# Patient Record
Sex: Male | Born: 1985 | Race: Black or African American | Hispanic: No | Marital: Single | Smoking: Never smoker
Health system: Southern US, Community
[De-identification: ages and names within clinical notes are randomized; demographics above are authoritative.]

---
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
## 2010-01-02 ENCOUNTER — Emergency Department (HOSPITAL_COMMUNITY): Admission: EM | Admit: 2010-01-02 | Discharge: 2010-01-02 | Payer: Self-pay | Admitting: Emergency Medicine

## 2010-03-28 ENCOUNTER — Emergency Department (HOSPITAL_COMMUNITY): Admission: EM | Admit: 2010-03-28 | Discharge: 2010-03-28 | Payer: Self-pay | Admitting: Emergency Medicine

## 2011-10-31 ENCOUNTER — Emergency Department (HOSPITAL_COMMUNITY)
Admission: EM | Admit: 2011-10-31 | Discharge: 2011-10-31 | Disposition: A | Discharge status: Home / Self Care | Payer: 59 | Source: Home / Self Care | Attending: Family Medicine | Admitting: Family Medicine

## 2011-10-31 ENCOUNTER — Encounter (HOSPITAL_COMMUNITY): Payer: Self-pay | Admitting: Emergency Medicine

## 2011-10-31 DIAGNOSIS — A084 Viral intestinal infection, unspecified: Secondary | ICD-10-CM

## 2011-10-31 DIAGNOSIS — J45909 Unspecified asthma, uncomplicated: Secondary | ICD-10-CM

## 2011-10-31 DIAGNOSIS — A09 Infectious gastroenteritis and colitis, unspecified: Secondary | ICD-10-CM

## 2011-10-31 MED ORDER — ALBUTEROL SULFATE HFA 108 (90 BASE) MCG/ACT IN AERS: 1.0000 | INHALATION_SPRAY | Freq: Four times a day (QID) | RESPIRATORY_TRACT | Status: DC | PRN

## 2011-10-31 MED ORDER — ONDANSETRON HCL 4 MG PO TABS: 4.0000 mg | ORAL_TABLET | Freq: Four times a day (QID) | ORAL | Status: AC

## 2011-10-31 NOTE — Discharge Instructions (Signed)
Clear liquid , bland diet today as tolerated, advance on wed as improved, use medicine as needed, return or see your doctor if any problems. °

## 2011-10-31 NOTE — ED Provider Notes (Signed)
History     CSN: [id number]  Arrival date & time 10/31/11  4098   First MD Initiated Contact with Patient 10/31/11 [phone]      Chief Complaint  Patient presents with  . Fatigue    (Consider location/radiation/quality/duration/timing/severity/associated sxs/prior treatment) Patient is a 26 y.o. male presenting with pharyngitis. The history is provided by the patient.  Sore Throat This is a new problem. The current episode started more than 1 week ago. The problem has been gradually improving. Associated symptoms comments: Now with [facility] with co-workers similarly ill.also nightly asthma acting up, .    Past Medical History  Diagnosis Date  . Asthma     History reviewed. No pertinent past surgical history.  No family history on file.  History  Substance Use Topics  . Smoking status: Never Smoker   . Smokeless tobacco: Not on file  . Alcohol Use: Yes      Review of Systems  Constitutional: Positive for appetite change. Negative for fever.  HENT: Positive for congestion, rhinorrhea and postnasal drip.   Respiratory: Positive for wheezing.   Gastrointestinal: Positive for nausea, vomiting and diarrhea.  Genitourinary: Negative.     Allergies  Shrimp  Home Medications   Current Outpatient Rx  Name Route Sig Dispense Refill  . ACETAMINOPHEN 325 MG PO TABS Oral Take 650 mg by mouth every 6 (six) hours as needed.    . ALBUTEROL SULFATE HFA 108 (90 BASE) MCG/ACT IN AERS Inhalation Inhale 1-2 puffs into the lungs every 6 (six) hours as needed for wheezing. 1 Inhaler 0  . ONDANSETRON HCL 4 MG PO TABS Oral Take 1 tablet (4 mg total) by mouth every 6 (six) hours. 8 tablet 0    BP 113/66  Pulse 61  Temp(Src) 98.5 F (36.9 C) (Oral)  Resp 14  SpO2 100%  Physical Exam  Nursing note and vitals reviewed. Constitutional: He appears well-developed and well-nourished.  HENT:  Head: Normocephalic.  Right Ear: External ear normal.  Left Ear: External ear normal.    Nose: Nose normal.  Mouth/Throat: Oropharynx is clear and moist.  Eyes: Pupils are equal, round, and reactive to light.  Neck: Normal range of motion. Neck supple.  Cardiovascular: Regular rhythm and normal heart sounds.   Pulmonary/Chest: Breath sounds normal.  Abdominal: Soft. Bowel sounds are normal. He exhibits no distension and no mass. There is no tenderness. There is no rebound and no guarding.  Lymphadenopathy:    He has no cervical adenopathy.  Skin: Skin is warm and dry.    ED Course  Procedures (including critical care time)  Labs Reviewed - No data to display No results found.   1. Gastroenteritis and colitis, viral   2. Asthma       MDM          Linna Hoff, MD 10/31/11 1130

## 2011-10-31 NOTE — ED Notes (Signed)
Onset one week ago of sore throat, this week patient has been feeling dizzy, nauseated, and weakness.  Throat no longer hurts.  Productive cough, dark brown phlegm per patient.  Reports asthma aggravated.

## 2011-12-07 ENCOUNTER — Emergency Department (HOSPITAL_COMMUNITY): Payer: 59

## 2011-12-07 ENCOUNTER — Encounter (HOSPITAL_COMMUNITY): Payer: Self-pay

## 2011-12-07 ENCOUNTER — Emergency Department (HOSPITAL_COMMUNITY)
Admission: EM | Admit: 2011-12-07 | Discharge: 2011-12-07 | Disposition: A | Discharge status: Home / Self Care | Payer: 59 | Attending: Emergency Medicine | Admitting: Emergency Medicine

## 2011-12-07 DIAGNOSIS — S6290XA Unspecified fracture of unspecified wrist and hand, initial encounter for closed fracture: Secondary | ICD-10-CM

## 2011-12-07 DIAGNOSIS — M25539 Pain in unspecified wrist: Secondary | ICD-10-CM | POA: Insufficient documentation

## 2011-12-07 DIAGNOSIS — J45909 Unspecified asthma, uncomplicated: Secondary | ICD-10-CM | POA: Insufficient documentation

## 2011-12-07 NOTE — Progress Notes (Signed)
Orthopedic Tech Progress Note Patient Details:  Ian Snyder 06/29/1985 [id number]  Ortho Devices Type of Ortho Device: Velcro wrist splint Ortho Device/Splint Location: left wrist Ortho Device/Splint Interventions: Application   Feather Berrie T 12/07/2011, 4:32 PM

## 2011-12-07 NOTE — Discharge Instructions (Signed)
Follow up with dr. Magnus Ivan next week.

## 2011-12-07 NOTE — ED Notes (Signed)
Ortho made aware of need for wrist splint

## 2011-12-07 NOTE — ED Provider Notes (Signed)
History    This chart was scribed for Benny Lennert, MD by Sofie Rower. The patient was seen in room TR09C/TR09C and the patient's care was started at 3:29 PM    CSN: [id number]  Arrival date & time 12/07/11  1420   None     Chief Complaint  Patient presents with  . Wrist Pain    (Consider location/radiation/quality/duration/timing/severity/associated sxs/prior treatment) Patient is a 26 y.o. male presenting with wrist pain. The history is provided by the patient. No language interpreter was used.  Wrist Pain This is a new problem. The current episode started 3 to 5 hours ago. The problem occurs constantly. The problem has not changed since onset.Pertinent negatives include no chest pain, no abdominal pain and no headaches. The symptoms are aggravated by bending. Nothing relieves the symptoms. He has tried nothing for the symptoms. The treatment provided no relief.    Past Medical History  Diagnosis Date  . Asthma     History  Substance Use Topics  . Smoking status: Never Smoker   . Smokeless tobacco: Not on file  . Alcohol Use: Yes      Review of Systems  Constitutional: Negative for fatigue.  HENT: Negative for congestion, sinus pressure and ear discharge.   Eyes: Negative for discharge.  Respiratory: Negative for cough.   Cardiovascular: Negative for chest pain.  Gastrointestinal: Negative for abdominal pain and diarrhea.  Genitourinary: Negative for frequency and hematuria.  Musculoskeletal: Negative for back pain.  Skin: Negative for rash.  Neurological: Negative for seizures and headaches.  Hematological: Negative.   Psychiatric/Behavioral: Negative for hallucinations.    Allergies  Shrimp  Home Medications   Current Outpatient Rx  Name Route Sig Dispense Refill  . ACETAMINOPHEN 325 MG PO TABS Oral Take 650 mg by mouth every 6 (six) hours as needed.    . ALBUTEROL SULFATE HFA 108 (90 BASE) MCG/ACT IN AERS Inhalation Inhale 1-2 puffs into the lungs  every 6 (six) hours as needed for wheezing. 1 Inhaler 0    BP 127/64  Pulse 62  Temp 98.6 F (37 C) (Oral)  Resp 16  SpO2 99%  Physical Exam  Nursing note and vitals reviewed. Constitutional: He is oriented to person, place, and time. He appears well-developed.  HENT:  Head: Normocephalic.  Eyes: Conjunctivae are normal.  Neck: No tracheal deviation present.  Cardiovascular:  No murmur heard. Musculoskeletal: Normal range of motion. He exhibits tenderness.       Swelling and tenderness to the mid dorsum of the left hand.   Neurological: He is oriented to person, place, and time.       Neurovascularly intact.   Skin: Skin is warm.  Psychiatric: He has a normal mood and affect.    ED Course  Procedures (including critical care time)  DIAGNOSTIC STUDIES: Oxygen Saturation is 99% on room air, normal by my interpretation.    COORDINATION OF CARE:  3:30PM- EDP at bedside discusses treatment plan concerning fracture.   5:53PM- EDP at bedside discusses results of x-ray.    Labs Reviewed - No data to display No results found for this or any previous visit. Dg Wrist Complete Left  12/07/2011  *RADIOLOGY REPORT*  Clinical Data: Extremity laceration  LEFT WRIST - COMPLETE 3+ VIEW  Comparison: None  Findings: On the lateral radiograph a fracture deformity is identified which likely corresponds to the dorsal aspect of the hamate bone.  This is difficult to see on the oblique and lateral projections. No other fractures  or subluxations identified.  IMPRESSION:  1.  Examination is positive for acute fracture.  This is felt to likely involving the dorsal aspect of the hamate bone.  Original Report Authenticated By: Rosealee Albee, M.D.      No diagnosis found.    MDM        The chart was scribed for me under my direct supervision.  I personally performed the history, physical, and medical decision making and all procedures in the evaluation of this patient.Benny Lennert, MD 12/07/11 [phone]

## 2011-12-07 NOTE — ED Notes (Signed)
Punched wall this am and had pain swelling nad deformity to left wrist.

## 2012-10-16 ENCOUNTER — Emergency Department (HOSPITAL_COMMUNITY)
Admission: EM | Admit: 2012-10-16 | Discharge: 2012-10-16 | Disposition: A | Discharge status: Home Health | Payer: 59 | Source: Home / Self Care

## 2012-10-16 ENCOUNTER — Encounter (HOSPITAL_COMMUNITY): Payer: Self-pay

## 2012-10-16 DIAGNOSIS — J45909 Unspecified asthma, uncomplicated: Secondary | ICD-10-CM

## 2012-10-16 DIAGNOSIS — J309 Allergic rhinitis, unspecified: Secondary | ICD-10-CM

## 2012-10-16 MED ORDER — ALBUTEROL SULFATE HFA 108 (90 BASE) MCG/ACT IN AERS: 2.0000 | INHALATION_SPRAY | RESPIRATORY_TRACT | Status: DC | PRN

## 2012-10-16 MED ORDER — MONTELUKAST SODIUM 10 MG PO TABS: 10.0000 mg | ORAL_TABLET | Freq: Every day | ORAL | Status: DC

## 2012-10-16 NOTE — ED Provider Notes (Signed)
History     CSN: [id number]  Arrival date & time 10/16/12  8110  27 year old male who presents to adult care clinic for evaluation of his asthma as well as medication refill. The patient primarily describes nocturnal symptoms. He occasionally has chest tightness during the day. However when he is in a recumbent position he prefers to use his inhaler for a daily basis at night. The patient also describes seasonal allergies and states that this year in particular, he experienced nasal congestion, exacerbation of his asthma symptoms more than the previous years.    Chief Complaint  Patient presents with  . Medication Refill    (Consider location/radiation/quality/duration/timing/severity/associated sxs/prior treatment) HPI  Past Medical History  Diagnosis Date  . Asthma     History reviewed. No pertinent past surgical history.  No family history on file.  History  Substance Use Topics  . Smoking status: Never Smoker   . Smokeless tobacco: Not on file  . Alcohol Use: Yes      Review of Systems orthopnea, wheezing at night Allergies  Shrimp  Home Medications   Current Outpatient Rx  Name  Route  Sig  Dispense  Refill  . acetaminophen (TYLENOL) 325 MG tablet   Oral   Take 650 mg by mouth every 6 (six) hours as needed.         Marland Kitchen albuterol (PROVENTIL HFA;VENTOLIN HFA) 108 (90 BASE) MCG/ACT inhaler   Inhalation   Inhale 2 puffs into the lungs every 4 (four) hours as needed for wheezing or shortness of breath.   1 Inhaler   10   . montelukast (SINGULAIR) 10 MG tablet   Oral   Take 1 tablet (10 mg total) by mouth at bedtime.   30 tablet   6     BP 129/64  Pulse 53  Temp(Src) 97.7 F (36.5 C) (Oral)  Resp 17  SpO2 95%  Physical Exam Neck: Normal range of motion. Neck supple. No JVD present. No tracheal deviation present. No thyromegaly present.  Cardiovascular: Normal rate, regular rhythm and normal heart sounds.  Pulmonary/Chest: Effort normal and breath  sounds normal. No respiratory distress. She has no wheezes.  Abdominal: Soft. Bowel sounds are normal.  Musculoskeletal: Normal range of motion. She exhibits no edema and no tenderness. Left 1st toe ingrown nail  Lymphadenopathy:  She has no cervical adenopathy.  Neurological: She is alert and oriented to person, place, and time. She has normal reflexes.  Skin: Skin is warm and dry.  Psychiatric: She has a normal mood and affect. Her behavior is normal. Judgment and thought content normal.   ED Course  Procedures (including critical care time)  Labs Reviewed - No data to display No results found.   1. Asthma, stable continue albuterol when necessary, follow up in 3 months   2. Allergic rhinitis start Singulair     Follow up in 3 months for asthma check  MDM         Richarda Overlie, MD 10/16/12 1323

## 2012-10-16 NOTE — ED Notes (Signed)
Patient has history of asthma Needs medication refill

## 2013-05-09 ENCOUNTER — Emergency Department (INDEPENDENT_AMBULATORY_CARE_PROVIDER_SITE_OTHER): Payer: 59

## 2013-05-09 ENCOUNTER — Encounter (HOSPITAL_COMMUNITY): Payer: Self-pay | Admitting: Emergency Medicine

## 2013-05-09 ENCOUNTER — Emergency Department (HOSPITAL_COMMUNITY)
Admission: EM | Admit: 2013-05-09 | Discharge: 2013-05-09 | Disposition: A | Discharge status: Home / Self Care | Payer: 59 | Source: Home / Self Care | Attending: Emergency Medicine | Admitting: Emergency Medicine

## 2013-05-09 ENCOUNTER — Ambulatory Visit (HOSPITAL_COMMUNITY)
Admit: 2013-05-09 | Discharge: 2013-05-09 | Disposition: A | Discharge status: Home / Self Care | Payer: 59 | Attending: Emergency Medicine | Admitting: Emergency Medicine

## 2013-05-09 DIAGNOSIS — S83429A Sprain of lateral collateral ligament of unspecified knee, initial encounter: Secondary | ICD-10-CM

## 2013-05-09 DIAGNOSIS — S83422A Sprain of lateral collateral ligament of left knee, initial encounter: Secondary | ICD-10-CM

## 2013-05-09 DIAGNOSIS — M7120 Synovial cyst of popliteal space [Baker], unspecified knee: Secondary | ICD-10-CM

## 2013-05-09 DIAGNOSIS — M79609 Pain in unspecified limb: Secondary | ICD-10-CM

## 2013-05-09 DIAGNOSIS — M7122 Synovial cyst of popliteal space [Baker], left knee: Secondary | ICD-10-CM

## 2013-05-09 DIAGNOSIS — M7989 Other specified soft tissue disorders: Secondary | ICD-10-CM | POA: Insufficient documentation

## 2013-05-09 MED ORDER — MELOXICAM 15 MG PO TABS: 15.0000 mg | ORAL_TABLET | Freq: Every day | ORAL | Status: DC

## 2013-05-09 MED ORDER — HYDROCODONE-ACETAMINOPHEN 5-325 MG PO TABS: ORAL_TABLET | ORAL | Status: DC

## 2013-05-09 MED ORDER — ACETAMINOPHEN 500 MG PO TABS
1000.0000 mg | ORAL_TABLET | Freq: Once | ORAL | Status: AC
  Administered 2013-05-09: 1000 mg via ORAL

## 2013-05-09 NOTE — Progress Notes (Signed)
*  Preliminary Results* Left lower extremity venous duplex completed. Left lower extremity is negative for deep vein thrombosis. There is evidence of a left Baker's cyst.  Preliminary results discussed with [facility].  05/09/2013 3:46 PM  Gertie Fey, RVT, RDCS, RDMS

## 2013-05-09 NOTE — ED Notes (Signed)
Called vascular lab and spoke with Ian Snyder.  She stated to send patient to [facility] to register and then to vascular lab.  Patient and Dr Lorenz Coaster made aware.  Patient taken by shuttle.

## 2013-05-09 NOTE — ED Provider Notes (Signed)
Chief Complaint:   Chief Complaint  Patient presents with  . Joint Swelling    History of Present Illness:   Ian Snyder is a 27 year old male who injured his left knee a month ago at work. He tripped over a pallet. Ever since then his left knee has been swollen, painful, and he's had pain in the popliteal fossa that extends down to the calf. It's worse if he walks or bends the knee. Sometimes the knee gives way and locks up. He denies any swelling of the ankle, there's been no chest pain or shortness of breath.  Review of Systems:  Other than noted above, the patient denies any of the following symptoms: Systemic:  No fevers, chills, sweats, or aches.  No fatigue or tiredness. Musculoskeletal:  No joint pain, arthritis, bursitis, swelling, back pain, or neck pain.  Neurological:  No muscular weakness, paresthesias, headache, or trouble with speech or coordination.  No dizziness.  PMFSH:  Past medical history, family history, social history, meds, and allergies were reviewed.  No prior history of knee pain or arthritis.  He has asthma and takes albuterol inhaler.  Physical Exam:   Vital signs:  BP 128/60  Pulse 54  Temp(Src) 98 F (36.7 C) (Oral)  Resp 14  SpO2 100% Gen:  Alert and oriented times 3.  In no distress. Musculoskeletal: There was pain to palpation over the lateral joint line and in the popliteal fossa. There was no palpable mass or cyst in the popliteal fossa. McMurray's test was negative.  Lachman's test was negative.  Anterior drawer test was negative.   Varus and valgus stress reveals some laxity of the lateral collateral ligament and pain with varus stress.  Otherwise, all joints had a full a ROM with no swelling, bruising or deformity.  No edema, pulses full. Extremities were warm and pink.  Capillary refill was brisk. He has moderate calf tenderness but negative Homans sign. Calf circumference was 43 cm bilaterally. Skin:  Clear, warm and dry.  No rash. Neuro:  Alert  and oriented times 3.  Muscle strength was normal.  Sensation was intact to light touch.   Results for orders placed during the hospital encounter of 05/09/13  D-DIMER, QUANTITATIVE      Result Value Range   D-Dimer, Quant 0.53 (*) 0.00 - 0.48 ug/mL-FEU   Radiology:  Dg Knee Complete 4 Views Left  05/09/2013   CLINICAL DATA:  Knee injury 1 month ago.  Now with posterior pain.  EXAM: LEFT KNEE - COMPLETE 4+ VIEW  COMPARISON:  None.  FINDINGS: No evidence for fracture. No subluxation or dislocation. No worrisome lytic or sclerotic osseous abnormality. There is a benign appearing sclerotic focus in the lateral aspect of the proximal tibial metaphysis, compatible with a healed nonossifying fibroma. No evidence for joint effusion.  IMPRESSION: No acute bony findings.  No evidence for joint effusion.   Electronically Signed   By: Kennith Center M.D.   On: 05/09/2013 13:44   I reviewed the images independently and personally and concur with the radiologist's findings.   Course in Urgent Care Center:   Since his d-dimer was mildly elevated he was sent to the hospital for a venous Doppler. This was negative for DVT but did show a popliteal cyst. He was placed in a knee immobilizer.  Assessment:  The primary encounter diagnosis was Popliteal cyst, left. A diagnosis of Lateral collateral ligament sprain of knee, left, initial encounter was also pertinent to this visit.  He'll need followup  with orthopedics. Given a note to rest for the next 3 days.  Plan:   1.  Meds:  The following meds were prescribed:   Discharge Medication List as of 05/09/2013  4:06 PM    START taking these medications   Details  HYDROcodone-acetaminophen (NORCO/VICODIN) 5-325 MG per tablet 1 to 2 tabs every 4 to 6 hours as needed for pain., Print    meloxicam (MOBIC) 15 MG tablet Take 1 tablet (15 mg total) by mouth daily., Starting 05/09/2013, Until Discontinued, Normal        2.  Patient Education/Counseling:  The patient  was given appropriate handouts, self care instructions, and instructed in symptomatic relief, including rest and activity, elevation, application of ice and compression.   3.  Follow up:  The patient was told to follow up if no better in 3 to 4 days, if becoming worse in any way, and given some red flag symptoms such as worsening pain which would prompt immediate return.  Follow up with Dr. Victorino Dike next week.     Reuben Likes, MD 05/09/13 2137

## 2013-05-09 NOTE — ED Notes (Signed)
States he injured his left knee about 1 month ago, and has pain in posterior aspect of left knee w swelling. Tender to palpation; NAD

## 2013-12-20 ENCOUNTER — Other Ambulatory Visit: Payer: Self-pay | Admitting: Internal Medicine

## 2014-01-06 ENCOUNTER — Emergency Department (HOSPITAL_COMMUNITY)
Admission: EM | Admit: 2014-01-06 | Discharge: 2014-01-06 | Disposition: A | Discharge status: Home / Self Care | Payer: 59 | Source: Home / Self Care | Attending: Family Medicine | Admitting: Family Medicine

## 2014-01-06 ENCOUNTER — Encounter (HOSPITAL_COMMUNITY): Payer: Self-pay | Admitting: Emergency Medicine

## 2014-01-06 DIAGNOSIS — J45909 Unspecified asthma, uncomplicated: Secondary | ICD-10-CM

## 2014-01-06 MED ORDER — ALBUTEROL SULFATE HFA 108 (90 BASE) MCG/ACT IN AERS: 2.0000 | INHALATION_SPRAY | RESPIRATORY_TRACT | Status: AC | PRN

## 2014-01-06 MED ORDER — MONTELUKAST SODIUM 10 MG PO TABS: 10.0000 mg | ORAL_TABLET | Freq: Every day | ORAL | Status: AC

## 2014-01-06 NOTE — ED Provider Notes (Signed)
Medical screening examination/treatment/procedure(s) were performed by resident physician or non-physician practitioner and as supervising physician I was immediately available for consultation/collaboration.   Rayan Ines DOUGLAS MD.   Xavia Kniskern D Kanae Ignatowski, MD 01/06/14 1702 

## 2014-01-06 NOTE — ED Provider Notes (Signed)
CSN: [id number]     Arrival date & time 01/06/14  1351 History   First MD Initiated Contact with Patient 01/06/14 1446     Chief Complaint  Patient presents with  . Medication Refill   (Consider location/radiation/quality/duration/timing/severity/associated sxs/prior Treatment) HPI Comments: 28 year old male with history of asthma presents requesting refill of his albuterol. His asthma has been flaring up at night and when he is outside for long time, he gets chest tightness and wheezing. He ran out of his inhaler 2 days ago. He was previously using it about once per day. He was previously on Singulair but ran out 2 months ago, when he was taking that he rarely ever used the inhaler. He denies any symptoms right now, but the symptoms at night have been persistent and consistent. He does not feel sick at all.   Past Medical History  Diagnosis Date  . Asthma     as a child   History reviewed. No pertinent past surgical history. History reviewed. No pertinent family history. History  Substance Use Topics  . Smoking status: Never Smoker   . Smokeless tobacco: Not on file  . Alcohol Use: No    Review of Systems  Respiratory: Positive for chest tightness and wheezing.   All other systems reviewed and are negative.   Allergies  Aspirin and Shrimp  Home Medications   Prior to Admission medications   Medication Sig Start Date End Date Taking? Authorizing Provider  acetaminophen (TYLENOL) 325 MG tablet Take 650 mg by mouth every 6 (six) hours as needed.    Historical Provider, MD  albuterol (PROVENTIL HFA;VENTOLIN HFA) 108 (90 BASE) MCG/ACT inhaler Inhale 2 puffs into the lungs every 4 (four) hours as needed for wheezing or shortness of breath. 01/06/14 01/06/15  Graylon GoodZachary H Sigmond Patalano, PA-C  HYDROcodone-acetaminophen (NORCO/VICODIN) 5-325 MG per tablet 1 to 2 tabs every 4 to 6 hours as needed for pain. 05/09/13   Reuben Likesavid C Keller, MD  meloxicam (MOBIC) 15 MG tablet Take 1 tablet (15 mg total) by  mouth daily. 05/09/13   Reuben Likesavid C Keller, MD  montelukast (SINGULAIR) 10 MG tablet Take 1 tablet (10 mg total) by mouth at bedtime. 01/06/14   Adrian BlackwaterZachary H Jovane Foutz, PA-C   BP 124/62  Pulse 50  Temp(Src) 98.6 F (37 C) (Oral)  Resp 14  SpO2 100% Physical Exam  Nursing note and vitals reviewed. Constitutional: He is oriented to person, place, and time. He appears well-developed and well-nourished. No distress.  HENT:  Head: Normocephalic.  Cardiovascular: Normal rate, regular rhythm and normal heart sounds.   Pulmonary/Chest: Effort normal and breath sounds normal. No respiratory distress. He has no wheezes. He has no rales.  Neurological: He is alert and oriented to person, place, and time. Coordination normal.  Skin: Skin is warm and dry. No rash noted. He is not diaphoretic.  Psychiatric: He has a normal mood and affect. Judgment normal.    ED Course  Procedures (including critical care time) Labs Review Labs Reviewed - No data to display  Imaging Review No results found.   MDM   1. Asthma, unspecified asthma severity, uncomplicated    Physical exam is completely normal at this time, no need for intervention right now. Will refill albuterol and Singulair. Followup with primary care   Meds ordered this encounter  Medications  . montelukast (SINGULAIR) 10 MG tablet    Sig: Take 1 tablet (10 mg total) by mouth at bedtime.    Dispense:  30 tablet  Refill:  2    Order Specific Question:  Supervising Provider    Answer:  Linna Hoff [phone]  . albuterol (PROVENTIL HFA;VENTOLIN HFA) 108 (90 BASE) MCG/ACT inhaler    Sig: Inhale 2 puffs into the lungs every 4 (four) hours as needed for wheezing or shortness of breath.    Dispense:  1 Inhaler    Refill:  2    Order Specific Question:  Supervising Provider    Answer:  Bradd Canary D [[id number]]       Graylon Good, PA-C 01/06/14 1504

## 2014-01-06 NOTE — ED Notes (Signed)
States he needs a refill of his inhaler " the blue one" ? Albuteral on his previous record.  States his asthma is flaring up at night.  He does yard work.

## 2014-01-06 NOTE — Discharge Instructions (Signed)

## 2014-01-07 NOTE — ED Notes (Signed)
Male called id herself as fiancee , asking for new Rx for him. He is not available to talk on phone, and was advised he will need to call , as we cannot discuss case w her. Patient was provided w rx as previously dispensed . No changes w/o MD approval or discussion w patient

## 2014-04-30 ENCOUNTER — Encounter (HOSPITAL_COMMUNITY): Payer: Self-pay | Admitting: Emergency Medicine

## 2014-04-30 ENCOUNTER — Emergency Department (HOSPITAL_COMMUNITY)
Admission: EM | Admit: 2014-04-30 | Discharge: 2014-04-30 | Disposition: A | Discharge status: Home / Self Care | Payer: 59 | Source: Home / Self Care | Attending: Emergency Medicine | Admitting: Emergency Medicine

## 2014-04-30 DIAGNOSIS — J069 Acute upper respiratory infection, unspecified: Secondary | ICD-10-CM

## 2014-04-30 DIAGNOSIS — R55 Syncope and collapse: Secondary | ICD-10-CM

## 2014-04-30 LAB — POCT I-STAT, CHEM 8
BUN: 17 mg/dL (ref 6–23)
Calcium, Ion: 1.21 mmol/L (ref 1.12–1.23)
Chloride: 102 mEq/L (ref 96–112)
Creatinine, Ser: 1 mg/dL (ref 0.50–1.35)
Glucose, Bld: 85 mg/dL (ref 70–99)
HCT: 46 % (ref 39.0–52.0)
Hemoglobin: 15.6 g/dL (ref 13.0–17.0)
Potassium: 4.1 mEq/L (ref 3.7–5.3)
SODIUM: 139 meq/L (ref 137–147)
TCO2: 26 mmol/L (ref 0–100)

## 2014-04-30 MED ORDER — VENTOLIN HFA 108 (90 BASE) MCG/ACT IN AERS: 2.0000 | INHALATION_SPRAY | RESPIRATORY_TRACT | Status: DC | PRN

## 2014-04-30 MED ORDER — IPRATROPIUM BROMIDE 0.06 % NA SOLN: 2.0000 | Freq: Four times a day (QID) | NASAL | Status: DC

## 2014-04-30 MED ORDER — MONTELUKAST SODIUM 10 MG PO TABS: 10.0000 mg | ORAL_TABLET | Freq: Every day | ORAL | Status: DC

## 2014-04-30 NOTE — ED Provider Notes (Signed)
Chief Complaint   Cough   History of Present Illness   Ian Snyder is a 28 year old male who had some mild URI symptoms this morning with nasal congestion and rhinorrhea. While at work he felt dizzy, lightheaded, and near syncope, but he did not pass out. He denied any whirling vertigo. He did not have any chest pain or shortness of breath. He has a history of asthma and is on pro-air. He's had a few loose stools. No fever or chills. He states he's been eating and drinking well. He denies any injury, new medications, drug or alcohol use, prior syncope, family history as syncope or heart disease, headache, chest or back pain, shortness of breath, or rectal bleeding.  Review of Systems   Other than noted above, the patient denies any of the following symptoms. Systemic:  No fever, chills, or fatigue. Pulmonary:  No cough, wheezing, shortness of breath. Cardiac:  No chest pain, tightness, pressure, palpitations, PND, orthopnea, or edema. Ext:  No leg pain or swelling. Neuro:  No weakness, paresthesias, or difficulty with speech or gait. No vertigo or difficulty with coordination or balance. No seizure activity, tongue biting, or incontinence.  Psych:  No anxiety or depression.  PMFSH   Past medical history, family history, social history, meds, and allergies were reviewed.    Physical Examination    Vital signs:  BP 124/62 mmHg  Pulse 59  Temp(Src) 98.4 F (36.9 C) (Oral)  Resp 20  SpO2 98%  Orthostatic VS for the past 24 hrs:  BP- Lying Pulse- Lying BP- Sitting Pulse- Sitting BP- Standing at 0 minutes Pulse- Standing at 0 minutes  04/30/14 1417 127/63 mmHg 63 117/76 mmHg 60 114/68 mmHg 66   Gen:  Alert, oriented, in no distress, skin warm and dry. Eye:  PERRL, lids and conjunctivas normal.  No stare or lid lag. ENT:  Mucous membranes moist, pharynx clear. Nasal mucosa was congested. Neck:  Supple, no adenopathy or tenderness.  No JVD.  Thyroid not enlarged. Lungs:  Clear to  auscultation, no wheezes, rales or rhonchi.  No respiratory distress. Heart:  Regular rhythm, no extrasystoles.  No gallops, murmers, clicks or rubs. Abdomen:  Soft, nontender, no organomegaly or mass.  Bowel sounds normal.  No pulsatile abdominal mass or bruit. Ext:  No edema. Pulses full and equal. Neuro:  Neurological examination: The patient is alert and oriented x3. Speech is clear, fluent, and appropriate. Cranial nerves are intact. There is no pronator drift and finger to nose was normal. Muscle strength, sensation, and DTRs are normal. Babinskis are downgoing. Station and gait were normal. Romberg sign is negative, patient is able to perform tandem gait well. Skin:  Warm and dry.  No rash.  Labs   Results for orders placed or performed during the hospital encounter of 04/30/14  I-STAT, chem 8  Result Value Ref Range   Sodium 139 137 - 147 mEq/L   Potassium 4.1 3.7 - 5.3 mEq/L   Chloride 102 96 - 112 mEq/L   BUN 17 6 - 23 mg/dL   Creatinine, Ser 1.611.00 0.50 - 1.35 mg/dL   Glucose, Bld 85 70 - 99 mg/dL   Calcium, Ion 0.961.21 0.451.12 - 1.23 mmol/L   TCO2 26 0 - 100 mmol/L   Hemoglobin 15.6 13.0 - 17.0 g/dL   HCT 40.946.0 81.139.0 - 91.452.0 %    EKG Results:  Date: 04/30/2014  Rate: 47  Rhythm: normal sinus rhythm and premature atrial contractions (PAC)  QRS Axis: normal  Intervals:  normal  ST/T Wave abnormalities: early repolarization  Conduction Disutrbances:none  Narrative Interpretation: Sinus bradycardia with blocked premature atrial complexes and early repolarization.  There was no indication of QT prolongation, WPW, Brugada syndrome, ischemia, arrhythmia, or hypertrophic cardiomyopathy.   Old EKG Reviewed: none available   Assessment   The primary encounter diagnosis was Viral URI. A diagnosis of Pre-syncope was also pertinent to this visit.  There is no evidence of subarachnoid hemorrhage, ruptured aortic aneurism, pulmonary embolism, acute coronary syndrome, aortic dissection, anemia,  blood loss, dehydration, or electrolyte imbalance.   Plan     1.  Meds:  The following meds were prescribed:   Discharge Medication List as of 04/30/2014  2:39 PM    START taking these medications   Details  ipratropium (ATROVENT) 0.06 % nasal spray Place 2 sprays into both nostrils 4 (four) times daily., Starting 04/30/2014, Until Discontinued, Normal    !! montelukast (SINGULAIR) 10 MG tablet Take 1 tablet (10 mg total) by mouth at bedtime., Starting 04/30/2014, Until Discontinued, Normal    !! VENTOLIN HFA 108 (90 BASE) MCG/ACT inhaler Inhale 2 puffs into the lungs every 4 (four) hours as needed for wheezing or shortness of breath., Starting 04/30/2014, Until Discontinued, Normal     !! - Potential duplicate medications found. Please discuss with provider.      2.  Patient Education/Counseling:  The patient was given appropriate handouts, self care instructions, and instructed in symptomatic relief.    3.  Follow up:  The patient was told to follow up here if no better in 3 to 4 days, or sooner if becoming worse in any way, and given some red flag symptoms such as syncope, presyncope, dyspnea, or chest pain which would prompt immediate return.      Reuben Likesavid C [employer] [employer], MD 04/30/14 [phone]

## 2014-04-30 NOTE — Discharge Instructions (Signed)

## 2014-04-30 NOTE — ED Notes (Signed)
Pt stateshe has been feeling sick this morning at work and he got light headed at work. Pt in no acute distress pt did not lose consiciousness.

## 2014-06-08 ENCOUNTER — Emergency Department (HOSPITAL_COMMUNITY)
Admission: EM | Admit: 2014-06-08 | Discharge: 2014-06-08 | Disposition: A | Discharge status: Home / Self Care | Payer: 59 | Source: Home / Self Care | Attending: Emergency Medicine | Admitting: Emergency Medicine

## 2014-06-08 ENCOUNTER — Encounter (HOSPITAL_COMMUNITY): Payer: Self-pay

## 2014-06-08 DIAGNOSIS — A084 Viral intestinal infection, unspecified: Secondary | ICD-10-CM

## 2014-06-08 MED ORDER — ONDANSETRON HCL 4 MG PO TABS: 4.0000 mg | ORAL_TABLET | Freq: Four times a day (QID) | ORAL | Status: DC

## 2014-06-08 NOTE — Discharge Instructions (Signed)
Viral Gastroenteritis Immodium AD for excessive diarrhea. Just slow it down, dont stop it. gatorade and water Viral gastroenteritis is also known as stomach flu. This condition affects the stomach and intestinal tract. It can cause sudden diarrhea and vomiting. The illness typically lasts 3 to 8 days. Most people develop an immune response that eventually gets rid of the virus. While this natural response develops, the virus can make you quite ill. CAUSES  Many different viruses can cause gastroenteritis, such as rotavirus or noroviruses. You can catch one of these viruses by consuming contaminated food or water. You may also catch a virus by sharing utensils or other personal items with an infected person or by touching a contaminated surface. SYMPTOMS  The most common symptoms are diarrhea and vomiting. These problems can cause a severe loss of body fluids (dehydration) and a body salt (electrolyte) imbalance. Other symptoms may include:  Fever.  Headache.  Fatigue.  Abdominal pain. DIAGNOSIS  Your caregiver can usually diagnose viral gastroenteritis based on your symptoms and a physical exam. A stool sample may also be taken to test for the presence of viruses or other infections. TREATMENT  This illness typically goes away on its own. Treatments are aimed at rehydration. The most serious cases of viral gastroenteritis involve vomiting so severely that you are not able to keep fluids down. In these cases, fluids must be given through an intravenous line (IV). HOME CARE INSTRUCTIONS   Drink enough fluids to keep your urine clear or pale yellow. Drink small amounts of fluids frequently and increase the amounts as tolerated.  Ask your caregiver for specific rehydration instructions.  Avoid:  Foods high in sugar.  Alcohol.  Carbonated drinks.  Tobacco.  Juice.  Caffeine drinks.  Extremely hot or cold fluids.  Fatty, greasy foods.  Too much intake of anything at one  time.  Dairy products until 24 to 48 hours after diarrhea stops.  You may consume probiotics. Probiotics are active cultures of beneficial bacteria. They may lessen the amount and number of diarrheal stools in adults. Probiotics can be found in yogurt with active cultures and in supplements.  Wash your hands well to avoid spreading the virus.  Only take over-the-counter or prescription medicines for pain, discomfort, or fever as directed by your caregiver. Do not give aspirin to children. Antidiarrheal medicines are not recommended.  Ask your caregiver if you should continue to take your regular prescribed and over-the-counter medicines.  Keep all follow-up appointments as directed by your caregiver. SEEK IMMEDIATE MEDICAL CARE IF:   You are unable to keep fluids down.  You do not urinate at least once every 6 to 8 hours.  You develop shortness of breath.  You notice blood in your stool or vomit. This may look like coffee grounds.  You have abdominal pain that increases or is concentrated in one small area (localized).  You have persistent vomiting or diarrhea.  You have a fever.  The patient is a child younger than 3 months, and he or she has a fever.  The patient is a child older than 3 months, and he or she has a fever and persistent symptoms.  The patient is a child older than 3 months, and he or she has a fever and symptoms suddenly get worse.  The patient is a baby, and he or she has no tears when crying. MAKE SURE YOU:   Understand these instructions.  Will watch your condition.  Will get help right away if you are  not doing well or get worse. Document Released: 05/29/2005 Document Revised: 08/21/2011 Document Reviewed: 03/15/2011 [facility] Patient Information 2015 [place], [place]. This information is not intended to replace advice given to you by your health care provider. Make sure you discuss any questions you have with your health care provider.

## 2014-06-08 NOTE — ED Notes (Signed)
States there  are several people at work who have had a stomach bug. C/o onset of Saturday of his symptoms. N/V/D, lower/mid abdominal pain. NAD

## 2014-06-08 NOTE — ED Provider Notes (Signed)
CSN: [id number]     Arrival date & time 06/08/14  1038 History   First MD Initiated Contact with Patient 06/08/14 1109     Chief Complaint  Patient presents with  . GI Problem   (Consider location/radiation/quality/duration/timing/severity/associated sxs/prior Treatment) HPI Comments: 28 year old male complaining of nausea, vomiting and diarrhea for approximately 36 hours. It is associated with mild intermittent left lower quadrant discomfort. The vomiting is slow down with 2 episodes yesterday and none today. He continues to have nausea. Diarrhea is too numerous to count. Having watery nonbloody stools. Denies fever or myalgias. Has been trying to eat food but this causes nausea and vomiting.   Past Medical History  Diagnosis Date  . Asthma     as a child   History reviewed. No pertinent past surgical history. History reviewed. No pertinent family history. History  Substance Use Topics  . Smoking status: Never Smoker   . Smokeless tobacco: Not on file  . Alcohol Use: No    Review of Systems  Constitutional: Positive for activity change. Negative for fever.  HENT: Negative.   Respiratory: Negative.   Cardiovascular: Negative for chest pain.  Gastrointestinal: Positive for nausea, vomiting, abdominal pain and diarrhea. Negative for constipation and blood in stool.  Genitourinary: Negative.   Musculoskeletal: Negative.     Allergies  Aspirin and Shrimp  Home Medications   Prior to Admission medications   Medication Sig Start Date End Date Taking? Authorizing Provider  acetaminophen (TYLENOL) 325 MG tablet Take 650 mg by mouth every 6 (six) hours as needed.    Historical Provider, MD  albuterol (PROVENTIL HFA;VENTOLIN HFA) 108 (90 BASE) MCG/ACT inhaler Inhale 2 puffs into the lungs every 4 (four) hours as needed for wheezing or shortness of breath. 01/06/14 01/06/15  Graylon GoodZachary H Baker, PA-C  ipratropium (ATROVENT) 0.06 % nasal spray Place 2 sprays into both nostrils 4 (four)  times daily. 04/30/14   Reuben Likesavid C Keller, MD  montelukast (SINGULAIR) 10 MG tablet Take 1 tablet (10 mg total) by mouth at bedtime. 01/06/14   Adrian BlackwaterZachary H Baker, PA-C  ondansetron (ZOFRAN) 4 MG tablet Take 1 tablet (4 mg total) by mouth every 6 (six) hours. 06/08/14   Hayden Rasmussenavid Jadasia Haws, NP  VENTOLIN HFA 108 (90 BASE) MCG/ACT inhaler Inhale 2 puffs into the lungs every 4 (four) hours as needed for wheezing or shortness of breath. 04/30/14   Reuben Likesavid C Keller, MD   BP 136/78 mmHg  Pulse 68  Temp(Src) 98.4 F (36.9 C) (Oral)  Resp 14  SpO2 100% Physical Exam  Constitutional: He is oriented to person, place, and time. He appears well-developed and well-nourished. No distress.  Eyes: Conjunctivae and EOM are normal.  Neck: Normal range of motion. Neck supple.  Cardiovascular: Normal rate, regular rhythm and normal heart sounds.   Pulmonary/Chest: Effort normal and breath sounds normal. No respiratory distress. He has no wheezes. He has no rales.  Abdominal: Soft. Bowel sounds are normal. He exhibits no distension. There is no rebound and no guarding.  Minor tenderness in the left lower quadrant.  Musculoskeletal: Normal range of motion. He exhibits no edema.  Lymphadenopathy:    He has no cervical adenopathy.  Neurological: He is alert and oriented to person, place, and time.  Skin: Skin is warm and dry.  Psychiatric: He has a normal mood and affect.  Nursing note and vitals reviewed.   ED Course  Procedures (including critical care time) Labs Review Labs Reviewed - No data to display  Imaging Review  No results found.   MDM   1. Viral gastroenteritis    Immodium AD for excessive diarrhea. Just slow it down, dont stop it. gatorade and water zofran as dir Diet as dir, bland, small amts when no more N or V. For new or worsening symptoms such as localized abdominal pain particularly in the right lower quadrant, fever, increased or worsening vomiting and diarrhea sick medical attention  promptly.    Hayden Rasmussenavid Liam Bossman, NP 06/08/14 1149
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
## 2016-04-20 ENCOUNTER — Encounter (HOSPITAL_COMMUNITY): Payer: Self-pay | Admitting: Emergency Medicine

## 2016-04-20 ENCOUNTER — Emergency Department (HOSPITAL_COMMUNITY)
Admission: EM | Admit: 2016-04-20 | Discharge: 2016-04-20 | Disposition: A | Discharge status: Home / Self Care | Payer: 59 | Attending: Emergency Medicine | Admitting: Emergency Medicine

## 2016-04-20 DIAGNOSIS — S3992XA Unspecified injury of lower back, initial encounter: Secondary | ICD-10-CM | POA: Diagnosis not present

## 2016-04-20 DIAGNOSIS — Y999 Unspecified external cause status: Secondary | ICD-10-CM | POA: Diagnosis not present

## 2016-04-20 DIAGNOSIS — M545 Low back pain, unspecified: Secondary | ICD-10-CM

## 2016-04-20 DIAGNOSIS — Y939 Activity, unspecified: Secondary | ICD-10-CM | POA: Insufficient documentation

## 2016-04-20 DIAGNOSIS — Y92410 Unspecified street and highway as the place of occurrence of the external cause: Secondary | ICD-10-CM | POA: Diagnosis not present

## 2016-04-20 DIAGNOSIS — M542 Cervicalgia: Secondary | ICD-10-CM | POA: Insufficient documentation

## 2016-04-20 DIAGNOSIS — J45909 Unspecified asthma, uncomplicated: Secondary | ICD-10-CM | POA: Diagnosis not present

## 2016-04-20 MED ORDER — METHOCARBAMOL 500 MG PO TABS: 500.0000 mg | ORAL_TABLET | Freq: Two times a day (BID) | ORAL | 0 refills | Status: DC | PRN

## 2016-04-20 MED ORDER — NAPROXEN 250 MG PO TABS: 250.0000 mg | ORAL_TABLET | Freq: Two times a day (BID) | ORAL | 0 refills | Status: DC
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
## 2019-04-24 ENCOUNTER — Other Ambulatory Visit: Payer: Self-pay

## 2019-04-24 ENCOUNTER — Encounter (HOSPITAL_COMMUNITY): Payer: Self-pay | Admitting: Urgent Care

## 2019-04-24 ENCOUNTER — Ambulatory Visit (HOSPITAL_COMMUNITY)
Admission: EM | Admit: 2019-04-24 | Discharge: 2019-04-24 | Disposition: A | Discharge status: Home / Self Care | Payer: Managed Care, Other (non HMO) | Attending: Urgent Care | Admitting: Urgent Care

## 2019-04-24 DIAGNOSIS — K529 Noninfective gastroenteritis and colitis, unspecified: Secondary | ICD-10-CM

## 2019-04-24 DIAGNOSIS — R110 Nausea: Secondary | ICD-10-CM

## 2019-04-24 MED ORDER — ONDANSETRON 8 MG PO TBDP: 8.0000 mg | ORAL_TABLET | Freq: Three times a day (TID) | ORAL | 0 refills | Status: DC | PRN

## 2019-04-24 MED ORDER — ONDANSETRON 8 MG PO TBDP: 8.0000 mg | ORAL_TABLET | Freq: Three times a day (TID) | ORAL | 0 refills | Status: AC | PRN

## 2019-04-24 NOTE — ED Provider Notes (Signed)
[place]   MRN: [id number] DOB: Feb 12, 1986  Subjective:   Ian Snyder is a 33 y.o. male presenting for 4-day history of nausea without vomiting and diarrhea.  Symptoms occurred after eating 9 entire pack of pistachios.  Bloating after working several bouts of diarrhea for that 1 day but has continued to feel quesy since then.  Has not taken medications for relief.  Denies any known COVID-19 contacts.  No current facility-administered medications for this encounter.   Current Outpatient Medications:  .  albuterol (PROVENTIL HFA;VENTOLIN HFA) 108 (90 BASE) MCG/ACT inhaler, Inhale 2 puffs into the lungs every 4 (four) hours as needed for wheezing or shortness of breath., Disp: 1 Inhaler, Rfl: 2 .  montelukast (SINGULAIR) 10 MG tablet, Take 1 tablet (10 mg total) by mouth at bedtime., Disp: 30 tablet, Rfl: 2   Allergies  Allergen Reactions  . Aspirin Hives    or Advil- not sure which.  . Shrimp [Shellfish Allergy]     Sea food    Past Medical History:  Diagnosis Date  . Asthma    as a child     No past surgical history on file.  No family history on file.  Social History   Tobacco Use  . Smoking status: Never Smoker  . Smokeless tobacco: Never Used  Substance Use Topics  . Alcohol use: No  . Drug use: No    Review of Systems  Constitutional: Negative for fever and malaise/fatigue.  HENT: Negative for congestion, ear pain, sinus pain and sore throat.   Eyes: Negative for discharge and redness.  Respiratory: Negative for cough, hemoptysis, shortness of breath and wheezing.   Cardiovascular: Negative for chest pain.  Gastrointestinal: Positive for diarrhea (Now resolved) and nausea. Negative for abdominal pain, blood in stool, constipation and vomiting.  Genitourinary: Negative for dysuria, flank pain and hematuria.  Musculoskeletal: Negative for myalgias.  Skin: Negative for rash.  Neurological: Negative for dizziness, weakness and headaches.   Psychiatric/Behavioral: Negative for depression and substance abuse.     Objective:   Vitals: BP 116/63 (BP Location: Right Arm)   Pulse (!) 58   Temp 98.5 F (36.9 C) (Oral)   Resp 16   SpO2 97%   Physical Exam Constitutional:      General: He is not in acute distress.    Appearance: Normal appearance. He is well-developed and normal weight. He is not ill-appearing, toxic-appearing or diaphoretic.  HENT:     Head: Normocephalic and atraumatic.     Right Ear: External ear normal.     Left Ear: External ear normal.     Nose: Nose normal.     Mouth/Throat:     Pharynx: Oropharynx is clear.  Eyes:     General: No scleral icterus.    Extraocular Movements: Extraocular movements intact.     Pupils: Pupils are equal, round, and reactive to light.  Cardiovascular:     Rate and Rhythm: Normal rate and regular rhythm.     Heart sounds: No murmur. No friction rub. No gallop.   Pulmonary:     Effort: Pulmonary effort is normal. No respiratory distress.     Breath sounds: No wheezing or rales.  Abdominal:     General: Bowel sounds are normal. There is no distension.     Palpations: Abdomen is soft. There is no mass.     Tenderness: There is no abdominal tenderness. There is no right CVA tenderness, left CVA tenderness, guarding or rebound.  Skin:  General: Skin is warm and dry.  Neurological:     Mental Status: He is alert and oriented to person, place, and time.  Psychiatric:        Mood and Affect: Mood normal.        Behavior: Behavior normal.        Thought Content: Thought content normal.        Judgment: Judgment normal.     Assessment and Plan :   1. Gastroenteritis   2. Nausea     We will manage for gastroenteritis with supportive care.  Recommended patient hydrate well, eat light meals and maintain electrolytes.  Will use Zofran nausea. Counseled patient on potential for adverse effects with medications prescribed/recommended today, ER and return-to-clinic  precautions discussed, patient verbalized understanding.    Wallis Bamberg, PA-C 04/24/19 1500

## 2019-04-24 NOTE — ED Triage Notes (Signed)
Pt states having nauseous x 3 days. Pt states he had diarrhea 5 days ago after he ate some pistachios. Pt denies any other symptoms.

## 2022-10-28 ENCOUNTER — Emergency Department (HOSPITAL_COMMUNITY)
Admission: EM | Admit: 2022-10-28 | Discharge: 2022-10-28 | Disposition: A | Discharge status: Home / Self Care | Payer: BC Managed Care – PPO | Attending: Emergency Medicine | Admitting: Emergency Medicine

## 2022-10-28 ENCOUNTER — Other Ambulatory Visit: Payer: Self-pay

## 2022-10-28 ENCOUNTER — Encounter (HOSPITAL_COMMUNITY): Payer: Self-pay

## 2022-10-28 ENCOUNTER — Emergency Department (HOSPITAL_COMMUNITY): Payer: BC Managed Care – PPO

## 2022-10-28 DIAGNOSIS — S60111A Contusion of right thumb with damage to nail, initial encounter: Secondary | ICD-10-CM | POA: Insufficient documentation

## 2022-10-28 DIAGNOSIS — W232XXA Caught, crushed, jammed or pinched between a moving and stationary object, initial encounter: Secondary | ICD-10-CM | POA: Diagnosis not present

## 2022-10-28 DIAGNOSIS — S6991XA Unspecified injury of right wrist, hand and finger(s), initial encounter: Secondary | ICD-10-CM | POA: Diagnosis present

## 2022-10-28 DIAGNOSIS — Z23 Encounter for immunization: Secondary | ICD-10-CM | POA: Insufficient documentation

## 2022-10-28 MED ORDER — TETANUS-DIPHTH-ACELL PERTUSSIS 5-2.5-18.5 LF-MCG/0.5 IM SUSY
0.5000 mL | PREFILLED_SYRINGE | Freq: Once | INTRAMUSCULAR | Status: AC
  Administered 2022-10-28: 0.5 mL via INTRAMUSCULAR
  Filled 2022-10-28: qty 0.5

## 2022-10-28 MED ORDER — LIDOCAINE HCL (PF) 1 % IJ SOLN
30.0000 mL | Freq: Once | INTRAMUSCULAR | Status: AC
  Administered 2022-10-28: 30 mL
  Filled 2022-10-28: qty 30

## 2022-10-28 NOTE — Discharge Instructions (Signed)
Thank you for letting us take care of you today.  Your x-ray was negative.  We drained the area under your nail with good result.  Please keep this clean and dry as we discussed.  For any new or worsening condition, please return to the nearest ED for reevaluation.

## 2022-10-28 NOTE — ED Provider Notes (Signed)
[place] EMERGENCY DEPARTMENT AT [facility] Provider Note   CSN: [id number] Arrival date & time: 10/28/22  1338     History  Chief Complaint  Patient presents with   Finger Injury    Ian Snyder is a 37 y.o. male who presents to ED c/o right thumb pain after jamming it in car door 5 days ago. No other injury. No paresthesias. No anticoagulant use.       Home Medications Prior to Admission medications   Medication Sig Start Date End Date Taking? Authorizing Provider  albuterol (PROVENTIL HFA;VENTOLIN HFA) 108 (90 BASE) MCG/ACT inhaler Inhale 2 puffs into the lungs every 4 (four) hours as needed for wheezing or shortness of breath. 01/06/14 01/06/15  Excell Seltzer, Adrian Blackwater, PA-C  montelukast (SINGULAIR) 10 MG tablet Take 1 tablet (10 mg total) by mouth at bedtime. 01/06/14   Excell Seltzer, Adrian Blackwater, PA-C  ondansetron (ZOFRAN-ODT) 8 MG disintegrating tablet Take 1 tablet (8 mg total) by mouth every 8 (eight) hours as needed for nausea. 04/24/19   Wallis Bamberg, PA-C  ipratropium (ATROVENT) 0.06 % nasal spray Place 2 sprays into both nostrils 4 (four) times daily. 04/30/14 04/24/19  Reuben Likes, MD      Allergies    Aspirin and Shrimp [shellfish allergy]    Review of Systems   Review of Systems  All other systems reviewed and are negative.   Physical Exam Updated Vital Signs BP (!) 137/94 (BP Location: Left Arm)   Pulse 67   Temp 98 F (36.7 C) (Oral)   Resp 16   SpO2 100%  Physical Exam Vitals and nursing note reviewed.  Constitutional:      General: He is not in acute distress.    Appearance: Normal appearance.  HENT:     Head: Normocephalic and atraumatic.     Mouth/Throat:     Mouth: Mucous membranes are moist.  Eyes:     Conjunctiva/sclera: Conjunctivae normal.  Cardiovascular:     Rate and Rhythm: Normal rate and regular rhythm.     Heart sounds: No murmur heard. Pulmonary:     Effort: Pulmonary effort is normal.  Abdominal:     General: Abdomen is  flat.     Palpations: Abdomen is soft.  Musculoskeletal:     Cervical back: Neck supple.     Right lower leg: No edema.     Left lower leg: No edema.     Comments: Subungual hematoma under right thumb with visualization of darkened, old appearing blood with extension just proximal to nail with mild amount of fluctuance and tenderness to palpation, nail is intact, no obvious deformity, neurovascularly intact, range of motion slightly limited at IP joint secondary to swelling, remainder range of motion intact   Skin:    General: Skin is warm and dry.     Capillary Refill: Capillary refill takes less than 2 seconds.  Neurological:     Mental Status: He is alert. Mental status is at baseline.  Psychiatric:        Behavior: Behavior normal.     ED Results / Procedures / Treatments   Labs (all labs ordered are listed, but only abnormal results are displayed) Labs Reviewed - No data to display  EKG None  Radiology DG Finger Thumb Right  Result Date: 10/28/2022 CLINICAL DATA:  Smashed thumb in car door EXAM: RIGHT THUMB 2+V COMPARISON:  None Available. FINDINGS: Soft tissue swelling about the distal thumb. No acute fracture or dislocation. IMPRESSION: No acute osseous  abnormality. Electronically Signed   By: Jeronimo Greaves M.D.   On: 10/28/2022 14:11    Procedures .Marland KitchenIncision and Drainage  Date/Time: 10/28/2022 2:35 PM  Performed by: Tonette Lederer, PA-C Authorized by: Tonette Lederer, PA-C   Consent:    Consent obtained:  Verbal   Consent given by:  Patient   Risks, benefits, and alternatives were discussed: yes     Risks discussed:  Bleeding, incomplete drainage, pain and infection   Alternatives discussed:  No treatment, delayed treatment and observation Universal protocol:    Procedure explained and questions answered to patient or proxy's satisfaction: yes     Immediately prior to procedure, a time out was called: yes     Patient identity confirmed:  Verbally with  patient Location:    Type:  Subungual hematoma   Location:  Upper extremity   Upper extremity location:  Finger   Finger location:  R thumb Pre-procedure details:    Skin preparation:  Povidone-iodine Sedation:    Sedation type:  None Anesthesia:    Anesthesia method:  Local infiltration   Local anesthetic:  Lidocaine 1% w/o epi Procedure type:    Complexity:  Simple Procedure details:    Ultrasound guidance: no     Incision types:  Stab incision   Incision depth:  Subungual   Wound management:  Extensive cleaning and probed and deloculated   Drainage:  Bloody   Drainage amount:  Moderate   Wound treatment:  Wound left open   Packing materials:  None Post-procedure details:    Procedure completion:  Tolerated well, no immediate complications     Medications Ordered in ED Medications  lidocaine (PF) (XYLOCAINE) 1 % injection 30 mL (has no administration in time range)  Tdap (BOOSTRIX) injection 0.5 mL (0.5 mLs Intramuscular Given 10/28/22 1436)    ED Course/ Medical Decision Making/ A&P                             Medical Decision Making Amount and/or Complexity of Data Reviewed Radiology: ordered. Decision-making details documented in ED Course.  Risk Prescription drug management.   Medical Decision Making:   Imhotep Score is a 37 y.o. male who presented to the ED today with finger injury detailed above.    Patient's presentation is complicated by their history of trauma.  Complete initial physical exam performed, notably the patient was in NAD. Neurovascularly intact.  Subungual hematoma under right thumb with extension just proximal to nail with mild amount of fluctuance and tenderness to palpation, nail is intact, no obvious deformity.   Reviewed and confirmed nursing documentation for past medical history, family history, social history.    Initial Assessment:   With the patient's presentation, differential diagnosis includes but is not limited to subungual  hematoma, fracture, dislocation, paronychia, nailbed laceration.  This is most consistent with an acute complicated illness  Initial Plan:  XR to evaluate for bony pathology Incision and drainage at patient request Objective evaluation as below reviewed   Initial Study Results:   Radiology:  All images reviewed independently. Agree with radiology report at this time.   DG Finger Thumb Right  Result Date: 10/28/2022 CLINICAL DATA:  Smashed thumb in car door EXAM: RIGHT THUMB 2+V COMPARISON:  None Available. FINDINGS: Soft tissue swelling about the distal thumb. No acute fracture or dislocation. IMPRESSION: No acute osseous abnormality. Electronically Signed   By: Jeronimo Greaves M.D.   On: 10/28/2022 14:11  Final Assessment and Plan:   37 year old male presents to the ED for evaluation of right thumb injury after slamming it in a car door.  On exam, he has subungual hematoma to the right thumb.  This is approximately 71 days old.  The bleeding under the nail appears to be dark red and dried but there is an area of fluctuance just proximal to the nailbed.  Discussed with patient that I do not believe trephination of the nail at this point in time course would be beneficial but area of fluctuance proximal to the nail would likely drain well.  Patient agreeable with this.  Incision and drainage as above.  Small incision made to the proximal aspect of the nailbed with good pain relief and good drainage of blood.  Discussed with patient wound care and strict ED return precautions.  He expressed understanding of plan.  All questions answered and stable for discharge.   Clinical Impression:  1. Subungual hematoma of right thumb, initial encounter      Discharge           Final Clinical Impression(s) / ED Diagnoses Final diagnoses:  Subungual hematoma of right thumb, initial encounter    Rx / DC Orders ED Discharge Orders     None         Tonette Lederer, PA-C 10/28/22 1458     Bethann Berkshire, MD 10/28/22 [phone]

## 2022-10-28 NOTE — ED Triage Notes (Signed)
Pt c/o R thumb injury.  Pain score 10/10 w/ palpation.  Pt reports smashing thumb in a door.
